# Patient Record
Sex: Female | Born: 2005 | Race: Black or African American | Hispanic: No | Marital: Single | Smoking: Never smoker
Health system: Southern US, Community
[De-identification: ages and names within clinical notes are randomized; demographics above are authoritative.]

## PROBLEM LIST (undated history)

## (undated) DIAGNOSIS — R040 Epistaxis: Secondary | ICD-10-CM

## (undated) DIAGNOSIS — J302 Other seasonal allergic rhinitis: Secondary | ICD-10-CM

## (undated) DIAGNOSIS — H6690 Otitis media, unspecified, unspecified ear: Secondary | ICD-10-CM

## (undated) DIAGNOSIS — D573 Sickle-cell trait: Secondary | ICD-10-CM

## (undated) HISTORY — DX: Otitis media, unspecified, unspecified ear: H66.90

## (undated) HISTORY — DX: Sickle-cell trait: D57.3

## (undated) HISTORY — DX: Epistaxis: R04.0

---
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
## 2023-10-09 ENCOUNTER — Encounter (HOSPITAL_COMMUNITY): Payer: Self-pay

## 2023-10-09 ENCOUNTER — Emergency Department (HOSPITAL_COMMUNITY)
Admission: EM | Admit: 2023-10-09 | Discharge: 2023-10-09 | Disposition: A | Discharge status: Home / Self Care | Payer: Managed Care, Other (non HMO) | Attending: Emergency Medicine | Admitting: Emergency Medicine

## 2023-10-09 ENCOUNTER — Other Ambulatory Visit: Payer: Self-pay

## 2023-10-09 DIAGNOSIS — R112 Nausea with vomiting, unspecified: Secondary | ICD-10-CM | POA: Insufficient documentation

## 2023-10-09 DIAGNOSIS — R109 Unspecified abdominal pain: Secondary | ICD-10-CM | POA: Insufficient documentation

## 2023-10-09 LAB — CBC
HCT: 35.7 % — ABNORMAL LOW (ref 36.0–46.0)
Hemoglobin: 11 g/dL — ABNORMAL LOW (ref 12.0–15.0)
MCH: 20.9 pg — ABNORMAL LOW (ref 26.0–34.0)
MCHC: 30.8 g/dL (ref 30.0–36.0)
MCV: 67.7 fL — ABNORMAL LOW (ref 80.0–100.0)
Platelets: 240 10*3/uL (ref 150–400)
RBC: 5.27 MIL/uL — ABNORMAL HIGH (ref 3.87–5.11)
RDW: 14.7 % (ref 11.5–15.5)
WBC: 8.8 10*3/uL (ref 4.0–10.5)
nRBC: 0 % (ref 0.0–0.2)

## 2023-10-09 LAB — RAPID URINE DRUG SCREEN, HOSP PERFORMED
Amphetamines: NOT DETECTED
Barbiturates: NOT DETECTED
Benzodiazepines: NOT DETECTED
Cocaine: NOT DETECTED
Opiates: NOT DETECTED
Tetrahydrocannabinol: POSITIVE — AB

## 2023-10-09 LAB — URINALYSIS, ROUTINE W REFLEX MICROSCOPIC
Bacteria, UA: NONE SEEN
Bilirubin Urine: NEGATIVE
Glucose, UA: NEGATIVE mg/dL
Hgb urine dipstick: NEGATIVE
Ketones, ur: NEGATIVE mg/dL
Leukocytes,Ua: NEGATIVE
Nitrite: NEGATIVE
Protein, ur: 30 mg/dL — AB
Specific Gravity, Urine: 1.024 (ref 1.005–1.030)
pH: 8 (ref 5.0–8.0)

## 2023-10-09 LAB — COMPREHENSIVE METABOLIC PANEL
ALT: 20 U/L (ref 0–44)
AST: 27 U/L (ref 15–41)
Albumin: 4.7 g/dL (ref 3.5–5.0)
Alkaline Phosphatase: 62 U/L (ref 38–126)
Anion gap: 8 (ref 5–15)
BUN: 12 mg/dL (ref 6–20)
CO2: 26 mmol/L (ref 22–32)
Calcium: 9.3 mg/dL (ref 8.9–10.3)
Chloride: 102 mmol/L (ref 98–111)
Creatinine, Ser: 0.66 mg/dL (ref 0.44–1.00)
GFR, Estimated: >60 mL/min (ref >60)
Glucose, Bld: 102 mg/dL — ABNORMAL HIGH (ref 70–99)
Potassium: 3.7 mmol/L (ref 3.5–5.1)
Sodium: 136 mmol/L (ref 135–145)
Total Bilirubin: 1.1 mg/dL (ref <1.2)
Total Protein: 8.2 g/dL — ABNORMAL HIGH (ref 6.5–8.1)

## 2023-10-09 LAB — HCG, SERUM, QUALITATIVE: Preg, Serum: NEGATIVE

## 2023-10-09 LAB — LIPASE, BLOOD: Lipase: 25 U/L (ref 11–51)

## 2023-10-09 MED ORDER — ONDANSETRON 4 MG PO TBDP: 4.0000 mg | ORAL_TABLET | Freq: Three times a day (TID) | ORAL | 0 refills | Status: DC | PRN

## 2023-10-09 MED ORDER — ONDANSETRON 4 MG PO TBDP: 4.0000 mg | ORAL_TABLET | Freq: Three times a day (TID) | ORAL | 0 refills | Status: AC | PRN

## 2023-10-09 MED ORDER — SODIUM CHLORIDE 0.9 % IV BOLUS
1000.0000 mL | Freq: Once | INTRAVENOUS | Status: AC
  Administered 2023-10-09: 1000 mL via INTRAVENOUS

## 2023-10-09 MED ORDER — ONDANSETRON HCL 4 MG/2ML IJ SOLN
4.0000 mg | Freq: Once | INTRAMUSCULAR | Status: AC
  Administered 2023-10-09: 4 mg via INTRAVENOUS
  Filled 2023-10-09: qty 2

## 2023-10-09 NOTE — ED Triage Notes (Signed)
Patient reports she was drinking last night and smoked marijuana yesterday. Since she has had severe nausea and vomiting. Attempted to stay hydrated, but was unable to keep anything down.

## 2023-10-09 NOTE — ED Provider Notes (Signed)
Tiffany Peters Provider Note   CSN: [id number] Arrival date & time: 10/09/23  1834     History  Chief Complaint  Patient presents with   Emesis    Tiffany Peters is a 17 y.o. female.   Emesis Patient presents with nausea vomiting and abdominal pain.  Began clear today.  Has not leaving able to keep things down.  No diarrhea.  Some crampy abdominal pain.  Did drink alcohol and smoked marijuana yesterday.  No fevers.  No definite sick contacts.  No blood.  Does not think she is pregnant.    Past Medical History:  Diagnosis Date   Epistaxis 08/24/2014   referred to ENT   Otitis media 2011   Seasonal allergies    Sickle cell trait (HCC)     Home Medications Prior to Admission medications   Medication Sig Start Date End Date Taking? Authorizing Provider  albuterol (PROVENTIL HFA;VENTOLIN HFA) 108 (90 BASE) MCG/ACT inhaler Inhale 2 puffs into the lungs every 4 (four) hours as needed for wheezing or shortness of breath. Patient not taking: Reported on 08/03/2016 08/24/14   Clifton Custard, MD  cetirizine (ZYRTEC) 10 MG tablet TAKE 1 TABLET EVERY DAY 06/18/16   Gregor Hams, NP  methylphenidate 18 MG PO CR tablet Take 1 tablet (18 mg total) by mouth daily. 10/03/16   Rockney Ghee, MD  methylphenidate 18 MG PO CR tablet Take 1 tablet (18 mg total) by mouth daily. 09/03/16   Rockney Ghee, MD  methylphenidate 18 MG PO CR tablet Take 1 tablet (18 mg total) by mouth daily. 08/03/16   Rockney Ghee, MD  montelukast (SINGULAIR) 5 MG chewable tablet CHEW 1 TABLET (5 MG TOTAL) BY MOUTH EVERY EVENING. Patient not taking: Reported on 08/03/2016 06/18/16   Gregor Hams, NP  NASONEX 50 MCG/ACT nasal spray PLACE 2 SPRAYS INTO THE NOSE DAILY. Patient not taking: Reported on 08/03/2016 08/26/15   Ettefagh, Aron Baba, MD  olopatadine (PATANOL) 0.1 % ophthalmic solution Place 1 drop into both eyes 2 (two) times daily as needed for  allergies. 02/02/17   Ettefagh, Aron Baba, MD  ondansetron (ZOFRAN-ODT) 4 MG disintegrating tablet Take 1 tablet (4 mg total) by mouth every 8 (eight) hours as needed for nausea or vomiting. 10/09/23   Benjiman Core, MD      Allergies    Amoxicillin and Omni-pac    Review of Systems   Review of Systems  Gastrointestinal:  Positive for vomiting.    Physical Exam Updated Vital Signs BP 101/60 (BP Location: Right Arm)   Pulse 89   Temp 97.8 F (36.6 C) (Oral)   Resp 19   Ht 5\' 5"  (1.651 m)   Wt 56.7 kg   SpO2 100%   BMI 20.80 kg/m  Physical Exam Vitals and nursing note reviewed.  HENT:     Head: Normocephalic.  Cardiovascular:     Rate and Rhythm: Regular rhythm.  Pulmonary:     Breath sounds: No wheezing or rhonchi.  Abdominal:     Tenderness: There is no abdominal tenderness.  Musculoskeletal:     Cervical back: Neck supple.  Neurological:     Mental Status: She is alert.     ED Results / Procedures / Treatments   Labs (all labs ordered are listed, but only abnormal results are displayed) Labs Reviewed  COMPREHENSIVE METABOLIC PANEL - Abnormal; Notable for the following components:      Result Value   Glucose, Bld 102 (*)  Total Protein 8.2 (*)    All other components within normal limits  CBC - Abnormal; Notable for the following components:   RBC 5.27 (*)    Hemoglobin 11.0 (*)    HCT 35.7 (*)    MCV 67.7 (*)    MCH 20.9 (*)    All other components within normal limits  URINALYSIS, ROUTINE W REFLEX MICROSCOPIC - Abnormal; Notable for the following components:   Protein, ur 30 (*)    All other components within normal limits  RAPID URINE DRUG SCREEN, HOSP PERFORMED - Abnormal; Notable for the following components:   Tetrahydrocannabinol POSITIVE (*)    All other components within normal limits  LIPASE, BLOOD  HCG, SERUM, QUALITATIVE    EKG None  Radiology No results found.  Procedures Procedures    Medications Ordered in ED Medications   ondansetron (ZOFRAN) injection 4 mg (4 mg Intravenous Given 10/09/23 1935)  sodium chloride 0.9 % bolus 1,000 mL (0 mLs Intravenous Stopped 10/09/23 2037)    ED Course/ Medical Decision Making/ A&P                                 Medical Decision Making Amount and/or Complexity of Data Reviewed Labs: ordered.  Risk Prescription drug management.   Patient with nausea and vomiting.  No definite sick contacts.  Differential diagnoses include gastroenteritis, also causes such as alcohol use and marijuana use as a cause.  Will get basic blood work and give fluids and antiemetics.  Blood work reassuring.  Feeling better and is tolerated orals.  Not pregnant.  Appears stable for discharge home.  Will treat symptomatically.        Final Clinical Impression(s) / ED Diagnoses Final diagnoses:  Nausea and vomiting, unspecified vomiting type    Rx / DC Orders ED Discharge Orders          Ordered    ondansetron (ZOFRAN-ODT) 4 MG disintegrating tablet  Every 8 hours PRN,   Status:  Discontinued        10/09/23 2205    ondansetron (ZOFRAN-ODT) 4 MG disintegrating tablet  Every 8 hours PRN        10/09/23 2217              Benjiman Core, MD 10/09/23 2254
